# Patient Record
Sex: Male | Born: 1960 | Race: White | Hispanic: No | Marital: Married | Smoking: Never smoker
Health system: Southern US, Community
[De-identification: ages and names within clinical notes are randomized; demographics above are authoritative.]

## PROBLEM LIST (undated history)

## (undated) DIAGNOSIS — C8590 Non-Hodgkin lymphoma, unspecified, unspecified site: Secondary | ICD-10-CM

## (undated) DIAGNOSIS — C801 Malignant (primary) neoplasm, unspecified: Secondary | ICD-10-CM

## (undated) HISTORY — PX: APPENDECTOMY: SHX54

## (undated) HISTORY — PX: GASTRIC BYPASS: SHX52

## (undated) HISTORY — PX: TONSILLECTOMY: SUR1361

---

## 2015-04-24 ENCOUNTER — Emergency Department (HOSPITAL_BASED_OUTPATIENT_CLINIC_OR_DEPARTMENT_OTHER): Payer: BLUE CROSS/BLUE SHIELD

## 2015-04-24 ENCOUNTER — Encounter (HOSPITAL_BASED_OUTPATIENT_CLINIC_OR_DEPARTMENT_OTHER): Payer: Self-pay | Admitting: *Deleted

## 2015-04-24 ENCOUNTER — Emergency Department (HOSPITAL_BASED_OUTPATIENT_CLINIC_OR_DEPARTMENT_OTHER)
Admission: EM | Admit: 2015-04-24 | Discharge: 2015-04-25 | Disposition: A | Discharge status: Home / Self Care | Payer: BLUE CROSS/BLUE SHIELD | Attending: Emergency Medicine | Admitting: Emergency Medicine

## 2015-04-24 DIAGNOSIS — R1031 Right lower quadrant pain: Secondary | ICD-10-CM | POA: Diagnosis present

## 2015-04-24 DIAGNOSIS — R109 Unspecified abdominal pain: Secondary | ICD-10-CM | POA: Diagnosis not present

## 2015-04-24 DIAGNOSIS — Z859 Personal history of malignant neoplasm, unspecified: Secondary | ICD-10-CM | POA: Insufficient documentation

## 2015-04-24 DIAGNOSIS — Z8579 Personal history of other malignant neoplasms of lymphoid, hematopoietic and related tissues: Secondary | ICD-10-CM | POA: Insufficient documentation

## 2015-04-24 HISTORY — DX: Non-Hodgkin lymphoma, unspecified, unspecified site: C85.90

## 2015-04-24 HISTORY — DX: Malignant (primary) neoplasm, unspecified: C80.1

## 2015-04-24 LAB — BASIC METABOLIC PANEL
Anion gap: 8 (ref 5–15)
BUN: 24 mg/dL — AB (ref 6–20)
CHLORIDE: 106 mmol/L (ref 101–111)
CO2: 21 mmol/L — AB (ref 22–32)
CREATININE: 1.59 mg/dL — AB (ref 0.61–1.24)
Calcium: 8.6 mg/dL — ABNORMAL LOW (ref 8.9–10.3)
GFR calc Af Amer: 55 mL/min — ABNORMAL LOW (ref >60)
GFR calc non Af Amer: 48 mL/min — ABNORMAL LOW (ref >60)
Glucose, Bld: 140 mg/dL — ABNORMAL HIGH (ref 65–99)
Potassium: 4.6 mmol/L (ref 3.5–5.1)
SODIUM: 135 mmol/L (ref 135–145)

## 2015-04-24 LAB — CBC WITH DIFFERENTIAL/PLATELET
BASOS ABS: 0 10*3/uL (ref 0.0–0.1)
BASOS PCT: 0 %
EOS ABS: 0.1 10*3/uL (ref 0.0–0.7)
Eosinophils Relative: 1 %
HCT: 35.4 % — ABNORMAL LOW (ref 39.0–52.0)
HEMOGLOBIN: 11.5 g/dL — AB (ref 13.0–17.0)
Lymphocytes Relative: 8 %
Lymphs Abs: 0.8 10*3/uL (ref 0.7–4.0)
MCH: 26.6 pg (ref 26.0–34.0)
MCHC: 32.5 g/dL (ref 30.0–36.0)
MCV: 81.8 fL (ref 78.0–100.0)
MONOS PCT: 8 %
Monocytes Absolute: 0.8 10*3/uL (ref 0.1–1.0)
NEUTROS ABS: 8.3 10*3/uL — AB (ref 1.7–7.7)
NEUTROS PCT: 83 %
Platelets: 231 10*3/uL (ref 150–400)
RBC: 4.33 MIL/uL (ref 4.22–5.81)
RDW: 14.2 % (ref 11.5–15.5)
WBC: 10 10*3/uL (ref 4.0–10.5)

## 2015-04-24 LAB — URINALYSIS, ROUTINE W REFLEX MICROSCOPIC
Bilirubin Urine: NEGATIVE
GLUCOSE, UA: NEGATIVE mg/dL
KETONES UR: 15 mg/dL — AB
Leukocytes, UA: NEGATIVE
NITRITE: NEGATIVE
PH: 5.5 (ref 5.0–8.0)
Protein, ur: >300 mg/dL — AB
SPECIFIC GRAVITY, URINE: 1.021 (ref 1.005–1.030)

## 2015-04-24 LAB — URINE MICROSCOPIC-ADD ON

## 2015-04-24 MED ORDER — ONDANSETRON HCL 4 MG/2ML IJ SOLN
4.0000 mg | Freq: Once | INTRAMUSCULAR | Status: AC
  Administered 2015-04-24: 4 mg via INTRAVENOUS
  Filled 2015-04-24: qty 2

## 2015-04-24 MED ORDER — KETOROLAC TROMETHAMINE 30 MG/ML IJ SOLN
30.0000 mg | Freq: Once | INTRAMUSCULAR | Status: AC
  Administered 2015-04-24: 30 mg via INTRAVENOUS
  Filled 2015-04-24: qty 1

## 2015-04-24 MED ORDER — HYDROMORPHONE HCL 1 MG/ML IJ SOLN
1.0000 mg | Freq: Once | INTRAMUSCULAR | Status: AC
Start: 2015-04-24 — End: 2015-04-24
  Administered 2015-04-24: 1 mg via INTRAVENOUS
  Filled 2015-04-24: qty 1

## 2015-04-24 MED ORDER — SODIUM CHLORIDE 0.9 % IV BOLUS (SEPSIS)
1000.0000 mL | Freq: Once | INTRAVENOUS | Status: AC
  Administered 2015-04-24: 1000 mL via INTRAVENOUS

## 2015-04-24 NOTE — ED Notes (Signed)
Right flank pain. Known stones in his right kidney. Pain has been on and off since Thursday.

## 2015-04-24 NOTE — ED Provider Notes (Signed)
CSN: [id number]     Arrival date & time 04/24/15  2054 History  By signing my name below, I, Eustaquio Maize, attest that this documentation has been prepared under the direction and in the presence of Harvel Quale, MD. Electronically Signed: Eustaquio Maize, ED Scribe. 04/24/2015. 10:14 PM.   Chief Complaint  Patient presents with  . Flank Pain   The history is provided by the patient. No language interpreter was used.     HPI Comments: Christopher Duffy is a 54 y.o. male who presents to the Emergency Department complaining of gradual onset, intermittent, RLQ pain radiating to right flank x 5 days. Pt mentions that his pain has been on and off since onset and that he did not have any pain this morning while at work but on the way home he began having sudden onset severe pain, prompting him to come to the ED. Pt was seen at [facility] days ago and had a CT Renal Stone Study with findings of right kidney stone. Pt mentions for the past 5 days he has been feeling hot to the touch and has been very sweaty. Denies fever, chills, nausea, vomiting, hematuria, or any other associated symptoms.    Past Medical History  Diagnosis Date  . Cancer ([place])   . Lymphoma [facility])    Past Surgical History  Procedure Laterality Date  . Appendectomy    . Tonsillectomy    . Gastric bypass     No family history on file. Social History  Substance Use Topics  . Smoking status: Never Smoker   . Smokeless tobacco: None  . Alcohol Use: No    Review of Systems  Constitutional: Negative for fever and chills.  Gastrointestinal: Positive for abdominal pain (RLQ). Negative for nausea and vomiting.  Genitourinary: Positive for flank pain (Right). Negative for hematuria.  All other systems reviewed and are negative.  Allergies  Ivp dye  Home Medications   Prior to Admission medications   Not on File   Triage VItals: BP 156/83 mmHg  Pulse 71  Temp(Src) 98.1 F (36.7 C) (Oral)  Resp 16  Ht 5'  8.5" (1.74 m)  Wt 185 lb (83.915 kg)  BMI 27.72 kg/m2  SpO2 98%   Physical Exam  Constitutional: He is oriented to person, place, and time. He appears well-developed and well-nourished. No distress.  HENT:  Head: Normocephalic and atraumatic.  Eyes: Conjunctivae and EOM are normal.  Neck: Neck supple. No tracheal deviation present.  Cardiovascular: Normal rate and regular rhythm.   Pulmonary/Chest: Effort normal. No respiratory distress.  Abdominal: Soft. He exhibits no distension. There is no tenderness. There is no rebound and no guarding.  Musculoskeletal: Normal range of motion.  Neurological: He is alert and oriented to person, place, and time.  Skin: Skin is warm and dry.  Psychiatric: He has a normal mood and affect. His behavior is normal.  Nursing note and vitals reviewed.   ED Course  Procedures (including critical care time)  DIAGNOSTIC STUDIES: Oxygen Saturation is 98% on RA, normal by my interpretation.    COORDINATION OF CARE: 10:11 PM-Discussed treatment plan which includes pain medication with pt at bedside and pt agreed to plan.   Labs Review Labs Reviewed  URINALYSIS, ROUTINE W REFLEX MICROSCOPIC (NOT AT [facility]) - Abnormal; Notable for the following:    Hgb urine dipstick MODERATE (*)    Ketones, ur 15 (*)    Protein, ur >300 (*)    All other components within  normal limits  URINE MICROSCOPIC-ADD ON - Abnormal; Notable for the following:    Squamous Epithelial / LPF 0-5 (*)    Bacteria, UA RARE (*)    All other components within normal limits  CBC WITH DIFFERENTIAL/PLATELET - Abnormal; Notable for the following:    Hemoglobin 11.5 (*)    HCT 35.4 (*)    Neutro Abs 8.3 (*)    All other components within normal limits  BASIC METABOLIC PANEL - Abnormal; Notable for the following:    CO2 21 (*)    Glucose, Bld 140 (*)    BUN 24 (*)    Creatinine, Ser 1.59 (*)    Calcium 8.6 (*)    GFR calc non Af Amer 48 (*)    GFR calc Af Amer 55 (*)    All other  components within normal limits    Imaging Review Ct Renal Stone Study  04/24/2015  CLINICAL DATA:  Right flank and lower quadrant pain for 5 days. Nephrolithiasis. EXAM: CT ABDOMEN AND PELVIS WITHOUT CONTRAST TECHNIQUE: Multidetector CT imaging of the abdomen and pelvis was performed following the standard protocol without IV contrast. COMPARISON:  None. FINDINGS: Lower chest:  No acute findings. Hepatobiliary: No mass visualized on this un-enhanced exam. Gallbladder is unremarkable. Pancreas: No mass or inflammatory process identified on this un-enhanced exam. Spleen: Within normal limits in size. Adrenals/Urinary Tract: Unremarkable adrenal glands. A few tiny 1 mm right renal calculi are noted. Mild right hydronephrosis and ureterectasis seen. 3 mm calculus is seen in the distal right ureter near the ureterovesical junction. No evidence of left-sided ureteral calculi or hydronephrosis. Stomach/Bowel: Postop changes seen from previous gastric bypass surgery. No evidence of obstruction, inflammatory process, or abnormal fluid collections. Vascular/Lymphatic: No pathologically enlarged lymph nodes. No evidence of abdominal aortic aneurysm. Reproductive: No mass or other significant abnormality. Other: None. Musculoskeletal:  No suspicious bone lesions identified. IMPRESSION: Mild right hydroureteronephrosis due to 3 mm distal ureteral calculus near the right ureterovesical junction. Electronically Signed   By: Earle Gell M.D.   On: 04/24/2015 22:49   I have personally reviewed and evaluated these images and lab results as part of my medical decision-making.   EKG Interpretation None      MDM  Patient was seen and evaluated in stable condition.  Results from previous outside ED visit reviewed.  Ct today showed improvement in hydronephrosis.  3 mm distal ureteral stone still present.  UA not consistent with infection.  Mildly elevated Cr.  Patient's pain controlled after toradol and dilaudid.  Patient  felt comfortable with plan for discharged and outpatient urology follow up.  Strict return precautions given. Patient discharged home in stable condition. Final diagnoses:  Right flank pain  1. Right ureteral calculus with mild obstruction  I personally performed the services described in this documentation, which was scribed in my presence. The recorded information has been reviewed and is accurate.      Harvel Quale, MD 04/25/15 [phone]

## 2015-04-25 MED ORDER — OXYCODONE-ACETAMINOPHEN 5-325 MG PO TABS: 1.0000 | ORAL_TABLET | ORAL | Status: AC | PRN

## 2015-04-25 MED ORDER — ONDANSETRON HCL 4 MG PO TABS: 4.0000 mg | ORAL_TABLET | Freq: Three times a day (TID) | ORAL | Status: AC | PRN

## 2015-04-25 NOTE — Discharge Instructions (Signed)
You were seen today for your pain from your kidney stone.  Take the pain medication as prescribed.  Follow up with urology outpatient.  Continue to take the Flomax prescribed at the previous facility.  Return with uncontrolled pain, in ability to tolerate eating/drinking, or fever.  Renal Colic Renal colic is pain that is caused by passing a kidney stone. The pain can be sharp and severe. It may be felt in the back, abdomen, side (flank), or groin. It can cause nausea. Renal colic can come and go. HOME CARE INSTRUCTIONS Watch your condition for any changes. The following actions may help to lessen any discomfort that you are feeling:  Take medicines only as directed by your health care provider.  Ask your health care provider if it is okay to take over-the-counter pain medicine.  Drink enough fluid to keep your urine clear or pale yellow. Drink 6-8 glasses of water each day.  Limit the amount of salt that you eat to less than 2 grams per day.  Reduce the amount of protein in your diet. Eat less meat, fish, nuts, and dairy.  Avoid foods such as spinach, rhubarb, nuts, or bran. These may make kidney stones more likely to form. SEEK MEDICAL CARE IF:  You have a fever or chills.  Your urine smells bad or looks cloudy.  You have pain or burning when you pass urine. SEEK IMMEDIATE MEDICAL CARE IF:  Your flank pain or groin pain suddenly worsens.  You become confused or disoriented or you lose consciousness.   This information is not intended to replace advice given to you by your health care provider. Make sure you discuss any questions you have with your health care provider.   Document Released: 02/13/2005 Document Revised: 05/27/2014 Document Reviewed: 03/16/2014 Elsevier Interactive Patient Education [employer].

## 2016-11-17 IMAGING — CT CT RENAL STONE PROTOCOL
2 of 4 series · 17 of 46 positions shown, 19 images · non-contrast
Comparison: None.

CLINICAL DATA: Right flank and lower quadrant pain for 5 days.
Nephrolithiasis.

EXAM:
CT ABDOMEN AND PELVIS WITHOUT CONTRAST
TECHNIQUE: Multidetector CT imaging of the abdomen and pelvis was performed
following the standard protocol without IV contrast.

[Series 2: renal stone < 200 lbs 5.0 b31f · axial · 0.75mm/px · z∈[-633,-278]mm · 14 of 77 slices shown, 16 images]
[im 3/77  soft-tissue]
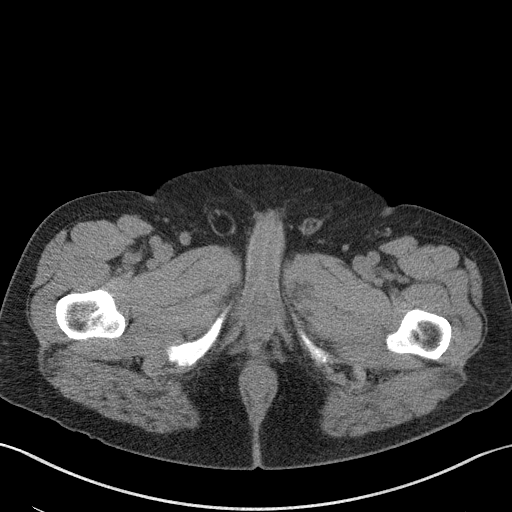
[im 3/77  bone]
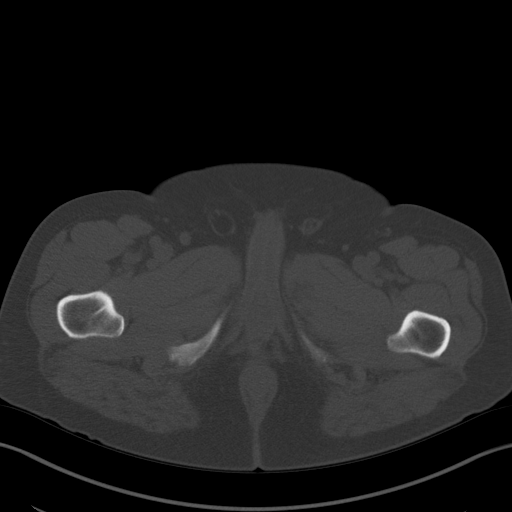
[im 9/77  soft-tissue]
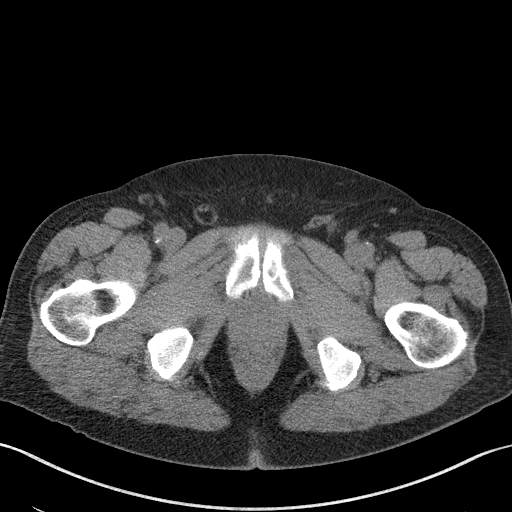
[im 15/77  soft-tissue]
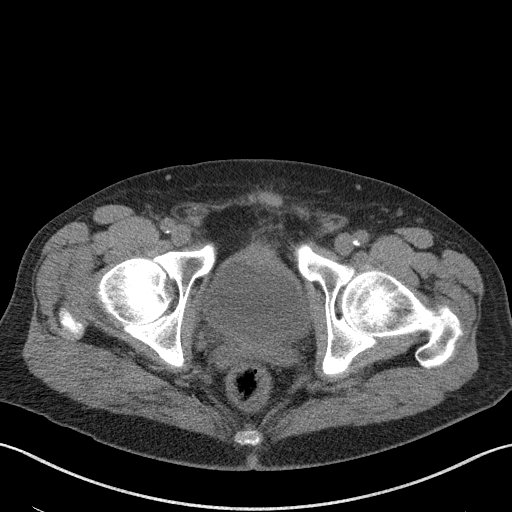
[im 20/77  soft-tissue]
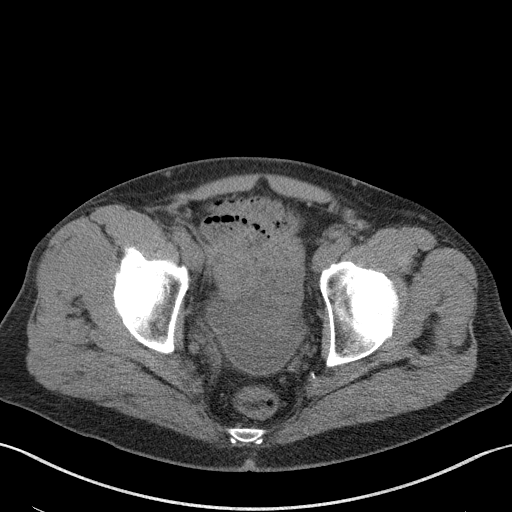
[im 26/77  soft-tissue]
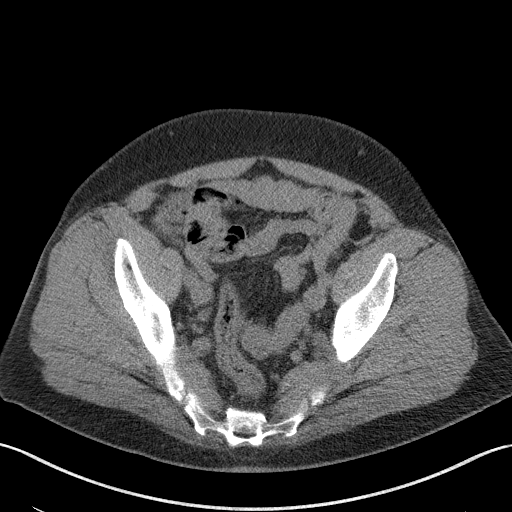
[im 31/77  soft-tissue]
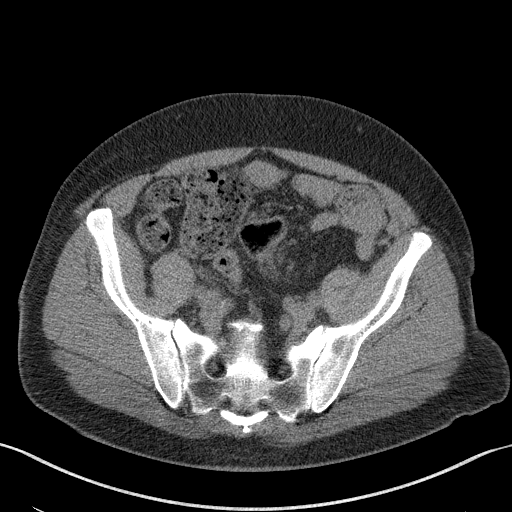
[im 37/77  soft-tissue]
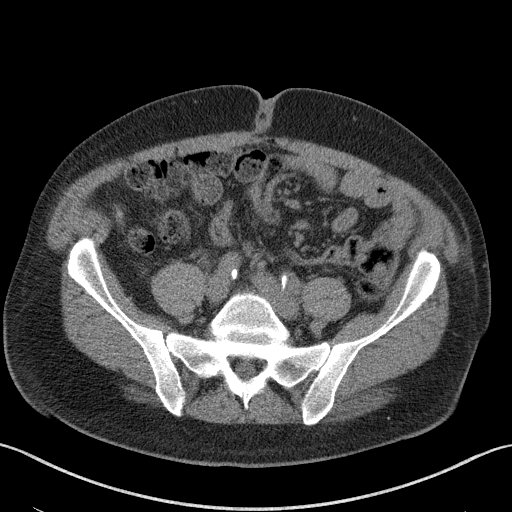
[im 40/77  soft-tissue]
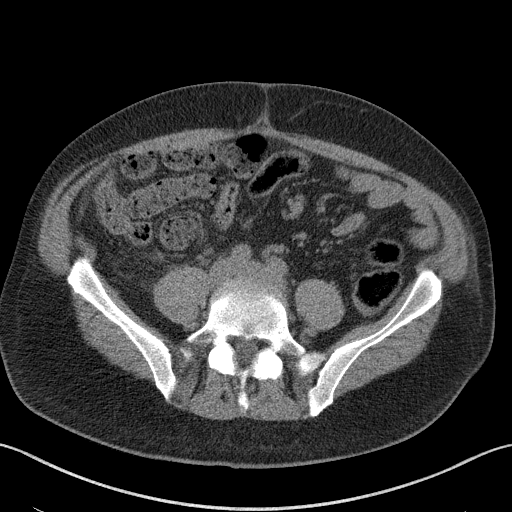
[im 46/77  soft-tissue]
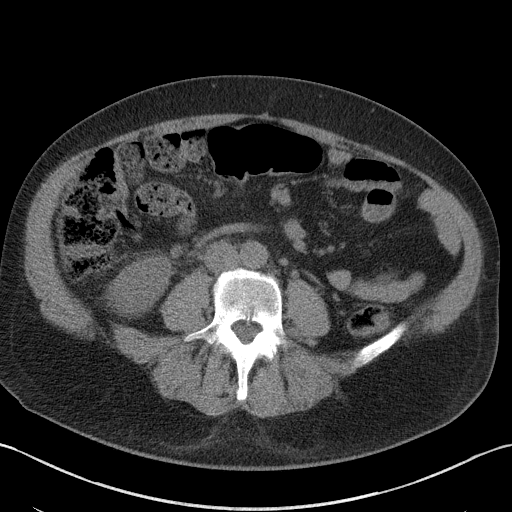
[im 46/77  bone]
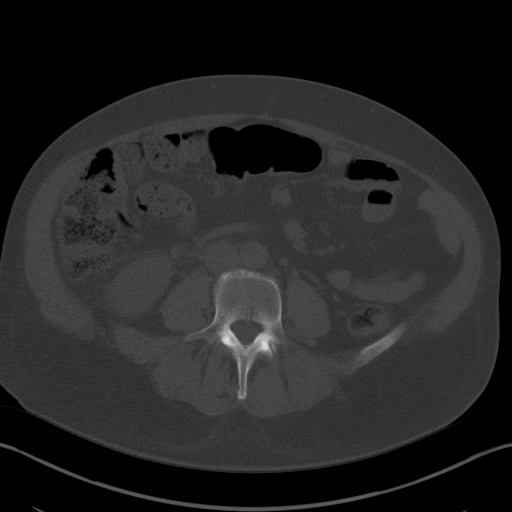
[im 51/77  soft-tissue]
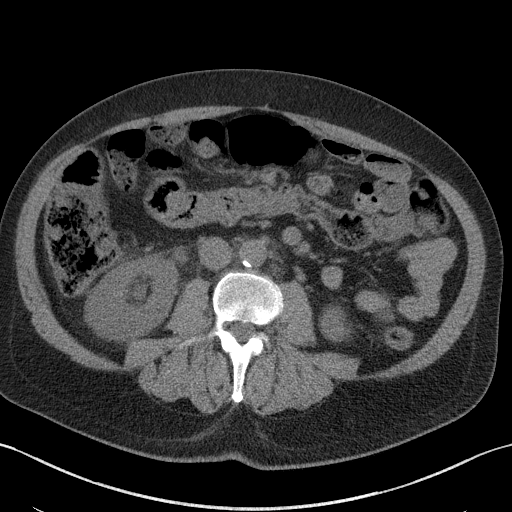
[im 57/77  soft-tissue]
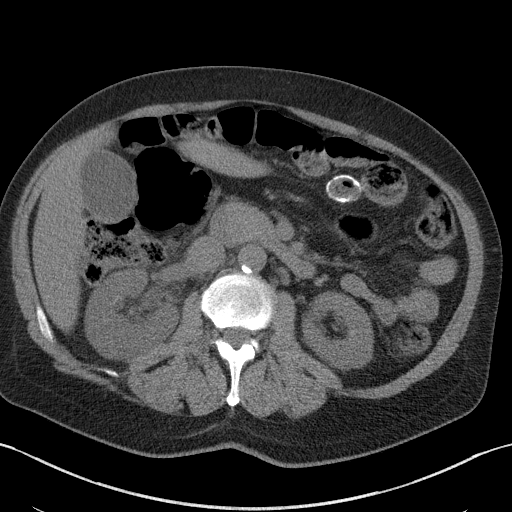
[im 62/77  soft-tissue]
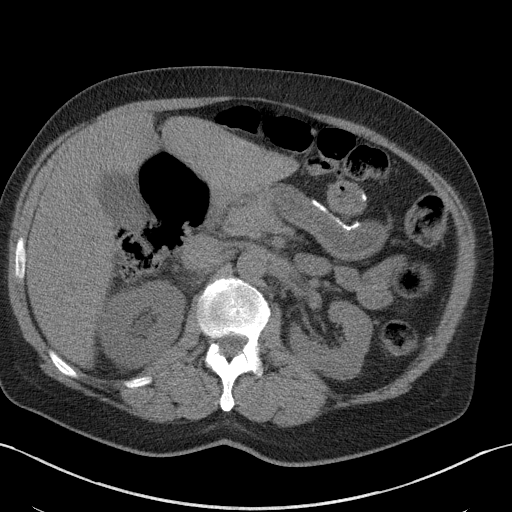
[im 68/77  soft-tissue]
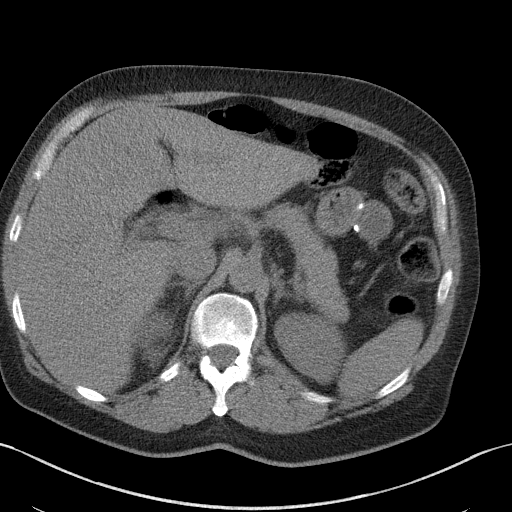
[im 74/77  soft-tissue]
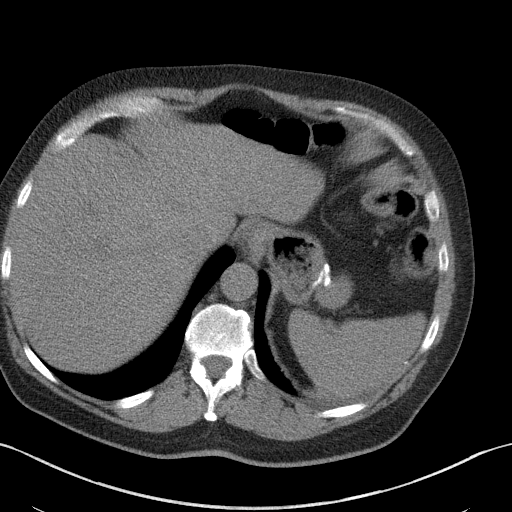

[Series 5: renal stone 3.0 coronal · coronal · 0.72mm/px · 3 of 97 slices shown]
[im 33/97  soft-tissue]
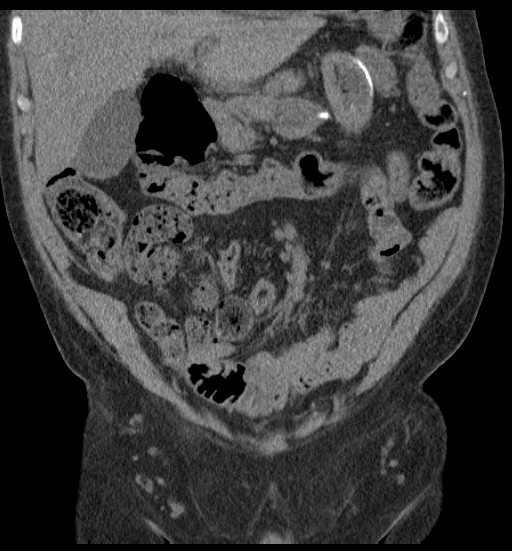
[im 43/97  soft-tissue]
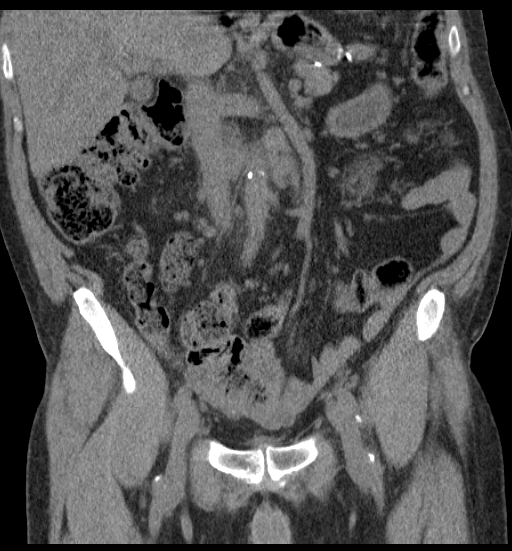
[im 54/97  soft-tissue]
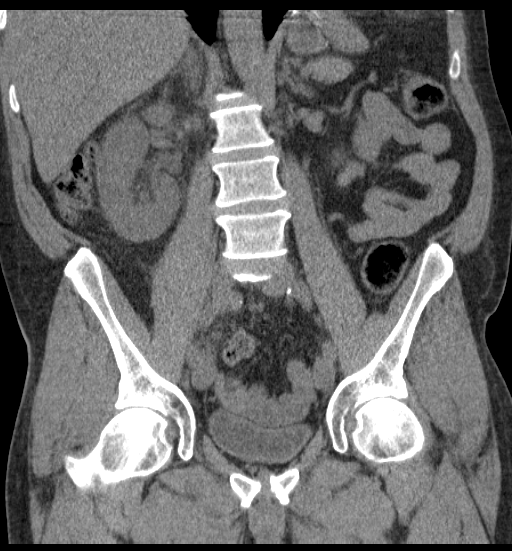

[17 of 46 positions shown; findings below may reference images not displayed]

FINDINGS: Lower chest:  No acute findings.

Hepatobiliary: No mass visualized on this un-enhanced exam.
Gallbladder is unremarkable.

Pancreas: No mass or inflammatory process identified on this
un-enhanced exam.

Spleen: Within normal limits in size.

Adrenals/Urinary Tract: Unremarkable adrenal glands. A few tiny 1 mm
right renal calculi are noted. Mild right hydronephrosis and
ureterectasis seen. 3 mm calculus is seen in the distal right ureter
near the ureterovesical junction. No evidence of left-sided ureteral
calculi or hydronephrosis.

Stomach/Bowel: Postop changes seen from previous gastric bypass
surgery. No evidence of obstruction, inflammatory process, or
abnormal fluid collections.

Vascular/Lymphatic: No pathologically enlarged lymph nodes. No
evidence of abdominal aortic aneurysm.

Reproductive: No mass or other significant abnormality.

Other: None.

Musculoskeletal:  No suspicious bone lesions identified.
IMPRESSION: Mild right hydroureteronephrosis due to 3 mm distal ureteral
calculus near the right ureterovesical junction.
